# Patient Record
Sex: Female | Born: 1990 | Race: Black or African American | Hispanic: No | Marital: Single | Smoking: Never smoker
Health system: Southern US, Community
[De-identification: ages and names within clinical notes are randomized; demographics above are authoritative.]

---
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
## 2015-12-31 ENCOUNTER — Other Ambulatory Visit (HOSPITAL_COMMUNITY)
Admission: RE | Admit: 2015-12-31 | Discharge: 2015-12-31 | Disposition: A | Discharge status: Home / Self Care | Payer: 59 | Source: Ambulatory Visit | Attending: Family Medicine | Admitting: Family Medicine

## 2015-12-31 ENCOUNTER — Encounter (HOSPITAL_COMMUNITY): Payer: Self-pay | Admitting: Emergency Medicine

## 2015-12-31 ENCOUNTER — Emergency Department (HOSPITAL_COMMUNITY)
Admission: EM | Admit: 2015-12-31 | Discharge: 2015-12-31 | Disposition: A | Discharge status: Home / Self Care | Payer: 59 | Source: Home / Self Care | Attending: Family Medicine | Admitting: Family Medicine

## 2015-12-31 DIAGNOSIS — Z113 Encounter for screening for infections with a predominantly sexual mode of transmission: Secondary | ICD-10-CM | POA: Insufficient documentation

## 2015-12-31 DIAGNOSIS — N72 Inflammatory disease of cervix uteri: Secondary | ICD-10-CM | POA: Diagnosis not present

## 2015-12-31 DIAGNOSIS — N760 Acute vaginitis: Secondary | ICD-10-CM | POA: Diagnosis present

## 2015-12-31 DIAGNOSIS — N898 Other specified noninflammatory disorders of vagina: Secondary | ICD-10-CM | POA: Diagnosis not present

## 2015-12-31 MED ORDER — METRONIDAZOLE 500 MG PO TABS: 500.0000 mg | ORAL_TABLET | Freq: Two times a day (BID) | ORAL | Status: AC

## 2015-12-31 NOTE — ED Provider Notes (Signed)
CSN: [id number]     Arrival date & time 12/31/15  1803 History   First MD Initiated Contact with Patient 12/31/15 1952     Chief Complaint  Patient presents with  . Blister  . Vaginal Itching   (Consider location/radiation/quality/duration/timing/severity/associated sxs/prior Treatment) HPI Comments: 25 year old female states that she felt bouts near the anus today. These were painless. She became oriented thinking they might be herpes so she came to the urgent care. She is unsure as to whether she has vaginal discharge or not. Denies pelvic pain denies urinary symptoms. She is sexually active and not utilizing condoms. LMP first week march/2017. noned missed, not late.  Patient is a 25 y.o. female presenting with vaginal itching.  Vaginal Itching    History reviewed. No pertinent past medical history. History reviewed. No pertinent past surgical history. No family history on file. Social History  Substance Use Topics  . Smoking status: Never Smoker   . Smokeless tobacco: None  . Alcohol Use: Yes     Comment: ocassionally   OB History    No data available     Review of Systems  Constitutional: Negative.   Respiratory: Negative.   Genitourinary: Positive for genital sores. Negative for dysuria, frequency, vaginal bleeding, vaginal pain and pelvic pain.  Musculoskeletal: Negative.   Neurological: Negative.   All other systems reviewed and are negative.   Allergies  Review of patient's allergies indicates no known allergies.  Home Medications   Prior to Admission medications   Medication Sig Start Date End Date Taking? Authorizing Provider  metroNIDAZOLE (FLAGYL) 500 MG tablet Take 1 tablet (500 mg total) by mouth 2 (two) times daily. X 7 days 12/31/15   Hayden Rasmussen, NP   Meds Ordered and Administered this Visit  Medications - No data to display  BP 127/81 mmHg  Pulse 95  Temp(Src) 98.6 F (37 C) (Oral)  Resp 16  SpO2 98%  LMP 12/16/2015 No data  found.   Physical Exam  Constitutional: She is oriented to person, place, and time. She appears well-developed and well-nourished. No distress.  Neck: Normal range of motion. Neck supple.  Cardiovascular: Normal rate.   Pulmonary/Chest: Effort normal. No respiratory distress.  Abdominal: Soft. There is no tenderness.  Genitourinary:  Normal external female genitalia. No lesions to the mucus layer of the labia and colitis. No lesions to the external female genitalia. There is a small amount of white vaginal discharge coating the walls and the cervix. Ectocervix with small papular vesicular lesions and red macules. No exudates or other fluid exuding from the os. No CMT or adnexal tenderness.  Anal exam reveals no evidence of herpetic lesions. No vesicles are seen. The only lesion appears to be lateral to the anus and this is a follicular inflammation likely due to shaving. No evidence of condyloma. No tenderness.  Musculoskeletal: Normal range of motion. She exhibits no edema.  Neurological: She is alert and oriented to person, place, and time. She exhibits normal muscle tone.  Skin: Skin is warm and dry.  Psychiatric: She has a normal mood and affect.  Nursing note and vitals reviewed.   ED Course  Procedures (including critical care time)  Labs Review Labs Reviewed  HERPES SIMPLEX VIRUS CULTURE    Imaging Review No results found.   Visual Acuity Review  Right Eye Distance:   Left Eye Distance:   Bilateral Distance:    Right Eye Near:   Left Eye Near:    Bilateral Near:  MDM   1. Vaginal discharge   2. Cervicitis    Meds ordered this encounter  Medications  . metroNIDAZOLE (FLAGYL) 500 MG tablet    Sig: Take 1 tablet (500 mg total) by mouth 2 (two) times daily. X 7 days    Dispense:  14 tablet    Refill:  0    Order Specific Question:  Supervising Provider    Answer:  Bradd CanaryKINDL, JAMES D [[id number]]   Viral herpetic culture pending Cervical cytology  pending    Hayden Rasmussenavid Silvino Selman, NP 12/31/15 2043

## 2015-12-31 NOTE — Discharge Instructions (Signed)
Cervicitis °Cervicitis is a soreness and swelling (inflammation) of the cervix. Your cervix is located at the bottom of your uterus. It opens up to the vagina. °CAUSES  °· Sexually transmitted infections (STIs).   °· Allergic reaction.   °· Medicines or birth control devices that are put in the vagina.   °· Injury to the cervix.   °· Bacterial infections.   °RISK FACTORS °You are at greater risk if you: °· Have unprotected sexual intercourse. °· Have sexual intercourse with many partners. °· Began sexual intercourse at an early age. °· Have a history of STIs. °SYMPTOMS  °There may be no symptoms. If symptoms occur, they may include:  °· Gray, white, yellow, or bad-smelling vaginal discharge.   °· Pain or itching of the area outside the vagina.   °· Painful sexual intercourse.   °· Lower abdominal or lower back pain, especially during intercourse.   °· Frequent urination.   °· Abnormal vaginal bleeding between periods, after sexual intercourse, or after menopause.   °· Pressure or a heavy feeling in the pelvis.   °DIAGNOSIS  °Diagnosis is made after a pelvic exam. Other tests may include:  °· Examination of any discharge under a microscope (wet prep).   °· A Pap test.   °TREATMENT  °Treatment will depend on the cause of cervicitis. If it is caused by an STI, both you and your partner will need to be treated. Antibiotic medicines will be given.  °HOME CARE INSTRUCTIONS  °· Do not have sexual intercourse until your health care provider says it is okay.   °· Do not have sexual intercourse until your partner has been treated, if your cervicitis is caused by an STI.   °· Take your antibiotics as directed. Finish them even if you start to feel better.   °SEEK MEDICAL CARE IF: °· Your symptoms come back.   °· You have a fever.   °MAKE SURE YOU:  °· Understand these instructions. °· Will watch your condition. °· Will get help right away if you are not doing well or get worse. °  °This information is not intended to replace  advice given to you by your health care provider. Make sure you discuss any questions you have with your health care provider. °  °Document Released: 09/29/2005 Document Revised: 10/04/2013 Document Reviewed: 03/23/2013 °Elsevier Interactive Patient Education ©2016 [employer] ° °

## 2015-12-31 NOTE — ED Notes (Signed)
Pt here with noticed anal blisters and vaginal itch with slight d/c, no odor noticed Pt is very upset and tearful of getting herpes Sx's noticed last week Admits to unprotected sex last month

## 2016-01-01 LAB — CERVICOVAGINAL ANCILLARY ONLY
Chlamydia: NEGATIVE
NEISSERIA GONORRHEA: NEGATIVE
Wet Prep (BD Affirm): POSITIVE — AB

## 2016-01-03 LAB — HERPES SIMPLEX VIRUS CULTURE
Culture: NOT DETECTED
SPECIAL REQUESTS: NORMAL

## 2016-01-09 ENCOUNTER — Telehealth (HOSPITAL_COMMUNITY): Payer: Self-pay | Admitting: Emergency Medicine

## 2016-01-09 NOTE — ED Notes (Signed)
Called pt and notified of recent lab results from visit Pt ID'd properly... Reports feeling better and sx have subsided  Per Dr. Dayton ScrapeMurray,  Please let patient know that herpes culture was negative. Tests for gonorrhea/chlamydia were negative. Test for gardnerella (bacterial vaginosis) was positive; rx for metronidazole was given at urgent care visit 12/31/15. Recheck for persistent symptoms. LM  Adv pt if sx are not getting better to return  Pt verb understanding Education on safe sex given

## 2016-02-28 ENCOUNTER — Encounter (HOSPITAL_COMMUNITY): Payer: Self-pay | Admitting: Emergency Medicine

## 2016-02-28 ENCOUNTER — Ambulatory Visit (HOSPITAL_COMMUNITY)
Admission: EM | Admit: 2016-02-28 | Discharge: 2016-02-28 | Disposition: A | Discharge status: Home / Self Care | Payer: 59 | Attending: Emergency Medicine | Admitting: Emergency Medicine

## 2016-02-28 DIAGNOSIS — A499 Bacterial infection, unspecified: Secondary | ICD-10-CM | POA: Diagnosis not present

## 2016-02-28 DIAGNOSIS — N760 Acute vaginitis: Secondary | ICD-10-CM | POA: Diagnosis not present

## 2016-02-28 DIAGNOSIS — B9689 Other specified bacterial agents as the cause of diseases classified elsewhere: Secondary | ICD-10-CM

## 2016-02-28 MED ORDER — METRONIDAZOLE 500 MG PO TABS: 500.0000 mg | ORAL_TABLET | Freq: Three times a day (TID) | ORAL | Status: AC

## 2016-02-28 NOTE — ED Notes (Signed)
Pt states she has been having some vaginal itching and discharge since Monday.  She denies any other symptoms.  Pt had same symptoms two months ago and was diagnosed with BV.  [facility] and has not gotten a positive result call so she does not think her symptoms are from an STD.

## 2016-02-28 NOTE — ED Provider Notes (Signed)
CSN: [id number]     Arrival date & time 02/28/16  1909 History   First MD Initiated Contact with Patient 02/28/16 1936     Chief Complaint  Patient presents with  . BV    (Consider location/radiation/quality/duration/timing/severity/associated sxs/prior Treatment) HPI History obtained from patient:  Pt presents with the cc of: Probable bacterial vaginosis Duration of symptoms: One week Treatment prior to arrival:'s treated approximately one month ago for bacterial vaginosis  Context: Things got better after antibiotics they came back approximately a month later. Other symptoms include:  Small amount of vaginal discharge but no odor Pain score: Itching no pain FAMILY HISTORY: No family history of diabetes or cancer SOCIAL HISTORY: Nonsmoker  History reviewed. No pertinent past medical history. History reviewed. No pertinent past surgical history. History reviewed. No pertinent family history. Social History  Substance Use Topics  . Smoking status: Never Smoker   . Smokeless tobacco: None  . Alcohol Use: Yes     Comment: ocassionally   OB History    No data available     Review of Systems  Denies: HEADACHE, NAUSEA, ABDOMINAL PAIN, CHEST PAIN, CONGESTION, DYSURIA, SHORTNESS OF BREATH  Allergies  Review of patient's allergies indicates no known allergies.  Home Medications   Prior to Admission medications   Medication Sig Start Date End Date Taking? Authorizing Provider  metroNIDAZOLE (FLAGYL) 500 MG tablet Take 1 tablet (500 mg total) by mouth 2 (two) times daily. X 7 days 12/31/15   Hayden Rasmussenavid Mabe, NP  metroNIDAZOLE (FLAGYL) 500 MG tablet Take 1 tablet (500 mg total) by mouth 3 (three) times daily. 02/28/16   Tharon AquasFrank C Patrick, PA   Meds Ordered and Administered this Visit  Medications - No data to display  BP 125/85 mmHg  Pulse 72  Temp(Src) 98.6 F (37 C) (Oral)  Resp 12  SpO2 100%  LMP 01/31/2016 (Exact Date) No data found.   Physical Exam NURSES NOTES AND VITAL  SIGNS REVIEWED. CONSTITUTIONAL: Well developed, well nourished, no acute distress HEENT: normocephalic, atraumatic EYES: Conjunctiva normal NECK:normal ROM, supple, no adenopathy PULMONARY:No respiratory distress, normal effort ABDOMINAL: Soft, ND, NT BS+, No CVAT MUSCULOSKELETAL: Normal ROM of all extremities,  SKIN: warm and dry without rash PSYCHIATRIC: Mood and affect, behavior are normal  ED Course  Procedures (including critical care time)  Labs Review Labs Reviewed - No data to display  Imaging Review No results found.   Visual Acuity Review  Right Eye Distance:   Left Eye Distance:   Bilateral Distance:    Right Eye Near:   Left Eye Near:    Bilateral Near:   prescription for metronidazole.  Total Visit Time:15 MINUTES "GREATER THAN 50% WAS SPENT IN COUNSELING AND COORDINATION OF CARE WITH THE PATIENT" DISCUSSION OF Causes and treatment of bacterial vaginosis. Advice on future care and prescription. MDM   1. BV (bacterial vaginosis)     Patient is reassured that there are no issues that require transfer to higher level of care at this time or additional tests. Patient is advised to continue home symptomatic treatment. Patient is advised that if there are new or worsening symptoms to attend the emergency department, contact primary care provider, or return to UC. Instructions of care provided discharged home in stable condition.    THIS NOTE WAS GENERATED USING A VOICE RECOGNITION SOFTWARE PROGRAM. ALL REASONABLE EFFORTS  WERE MADE TO PROOFREAD THIS DOCUMENT FOR ACCURACY.  I have verbally reviewed the discharge instructions with the patient. A printed AVS was given to  the patient.  All questions were answered prior to discharge.      Tharon Aquas, PA 02/28/16 2030

## 2016-02-28 NOTE — Discharge Instructions (Signed)

## 2017-07-06 ENCOUNTER — Other Ambulatory Visit: Payer: Self-pay | Admitting: Occupational Medicine

## 2017-07-06 ENCOUNTER — Ambulatory Visit: Payer: Self-pay

## 2017-07-06 DIAGNOSIS — Z0000 Encounter for general adult medical examination without abnormal findings: Secondary | ICD-10-CM
